# Patient Record
Sex: Female | Born: 2005 | Race: White | Hispanic: No | Marital: Single | Smoking: Never smoker
Health system: Southern US, Community
[De-identification: ages and names within clinical notes are randomized; demographics above are authoritative.]

## PROBLEM LIST (undated history)

## (undated) DIAGNOSIS — G43109 Migraine with aura, not intractable, without status migrainosus: Secondary | ICD-10-CM

## (undated) HISTORY — PX: UPPER GI ENDOSCOPY: SHX6162

## (undated) HISTORY — PX: OTHER SURGICAL HISTORY: SHX169

## (undated) HISTORY — PX: MOUTH SURGERY: SHX715

## (undated) HISTORY — DX: Migraine with aura, not intractable, without status migrainosus: G43.109

---
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
## 2024-10-19 ENCOUNTER — Other Ambulatory Visit: Payer: Self-pay

## 2024-10-19 ENCOUNTER — Emergency Department
Admission: EM | Admit: 2024-10-19 | Discharge: 2024-10-19 | Disposition: A | Discharge status: Home / Self Care | Payer: Worker's Compensation | Source: Ambulatory Visit | Attending: Emergency Medicine | Admitting: Emergency Medicine

## 2024-10-19 DIAGNOSIS — S0003XA Contusion of scalp, initial encounter: Secondary | ICD-10-CM | POA: Diagnosis not present

## 2024-10-19 DIAGNOSIS — Y990 Civilian activity done for income or pay: Secondary | ICD-10-CM | POA: Insufficient documentation

## 2024-10-19 DIAGNOSIS — W208XXA Other cause of strike by thrown, projected or falling object, initial encounter: Secondary | ICD-10-CM | POA: Diagnosis not present

## 2024-10-19 DIAGNOSIS — S0990XA Unspecified injury of head, initial encounter: Secondary | ICD-10-CM

## 2024-10-19 DIAGNOSIS — R519 Headache, unspecified: Secondary | ICD-10-CM | POA: Diagnosis present

## 2024-10-19 MED ORDER — METOCLOPRAMIDE HCL 5 MG PO TABS: 5.0000 mg | ORAL_TABLET | Freq: Three times a day (TID) | ORAL | 1 refills | Status: AC | PRN

## 2024-10-19 MED ORDER — METOCLOPRAMIDE HCL 10 MG PO TABS
10.0000 mg | ORAL_TABLET | Freq: Once | ORAL | Status: AC
  Administered 2024-10-19: 10 mg via ORAL
  Filled 2024-10-19: qty 1

## 2024-10-19 NOTE — ED Triage Notes (Signed)
 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 Patient complaining dizziness and headache.

## 2024-10-19 NOTE — ED Provider Notes (Signed)
 [facility] Emergency Department Provider Note     Event Date/Time   First MD Initiated Contact with Patient 10/19/24 1206     (approximate)   History   Headache   HPI  Heidi Curry is a 18 y.o. female with a history of migraine, presents to the ED endorsing intermittent headache and dizziness after work-related injury.  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  Denies any LOC, nausea, vomiting, vision loss, or weakness.  She presents to the ED for evaluation of injuries.  No reports of any distal paresthesias, vision change, tinnitus, or vertigo.  She endorses some mild tenderness over crown of her scalp, but denies any gross scalp laceration or bleeding.  No medications were taken in the interim for symptom relief.   Physical Exam   Triage Vital Signs: ED Triage Vitals  Encounter Vitals Group     BP 10/19/24 1144 131/87     Girls Systolic BP Percentile --      Girls Diastolic BP Percentile --      Boys Systolic BP Percentile --      Boys Diastolic BP Percentile --      Pulse Rate 10/19/24 1144 66     Resp 10/19/24 1144 18     Temp 10/19/24 1144 98.1 F (36.7 C)     Temp Source 10/19/24 1144 Oral     SpO2 10/19/24 1144 100 %     Weight 10/19/24 1146 183 lb (83 kg)     Height 10/19/24 1146 5' 3 (1.6 m)     Head Circumference --      Peak Flow --      Pain Score --      Pain Loc --      Pain Education --      Exclude from Growth Chart --     Most recent vital signs: Vitals:   10/19/24 1144  BP: 131/87  Pulse: 66  Resp: 18  Temp: 98.1 F (36.7 C)  SpO2: 100%    General Awake, no distress.  NAD.  A&O x 4 HEENT NCAT no scalp laceration, hematoma, abrasion, ecchymosis, or erythema.SABRA PERRL. EOMI. normal fundi bilaterally.  No rhinorrhea. Mucous membranes are moist.  TMs intact bilaterally without evidence of purulent serous effusion, no hemotympanum, no Battle sign noted. CV:  Good  peripheral perfusion.  RESP:  Normal effort.  MSK:  Normal spinal alignment without midline tenderness, spasm, deformity, or step-off.  AROM of all extremities. NEURO: Cranial nerves II to XII grossly intact.  No cerebellar ataxia appreciated.  Normal rapid alternating movement.  No pronator drift appreciated.  Normal tandem walk.   ED Results / Procedures / Treatments   Labs (all labs ordered are listed, but only abnormal results are displayed) Labs Reviewed - No data to display   EKG   RADIOLOGY  No results found.   PROCEDURES:  Critical Care performed: No  Procedures   MEDICATIONS ORDERED IN ED: Medications  metoCLOPramide (REGLAN) tablet 10 mg (10 mg Oral Given 10/19/24 1246)     IMPRESSION / MDM / ASSESSMENT AND PLAN / ED COURSE  I reviewed the triage vital signs and the nursing notes.                              Differential diagnosis includes, but is not limited to, head contusion, posttraumatic headache, hematoma,  abrasion, laceration  Patient's presentation is most consistent with acute, uncomplicated illness.  Patient's diagnosis is consistent with scalp contusion and minor head injury.  Patient with reassuring exam and workup following head contusion while at work yesterday.  Patient presents today in no acute distress.  She is active, alert, oriented.  She is able to give full HPI without prompts.  No neurodeficits on exam.  Patient's clinical picture is that of a minor head contusion.  No concern for serious closed head injury or postconcussive syndrome.  No indication based on my interpretation for CT imaging exam low concern for subdural hemorrhage or COVID injury.  Patient is reassured by her exam and workup at this time.  Patient will be discharged home with prescriptions for Reglan and OTC pain medicine for any needed pain relief. Patient is to follow up with her PCP as needed or otherwise directed. Patient is given ED precautions to return to the ED for  any worsening or new symptoms.     FINAL CLINICAL IMPRESSION(S) / ED DIAGNOSES   Final diagnoses:  Contusion of scalp, initial encounter  Minor head injury, initial encounter     Rx / DC Orders   ED Discharge Orders          Ordered    metoCLOPramide (REGLAN) 5 MG tablet  Every 8 hours PRN        10/19/24 1235             Note:  This document was prepared using Dragon voice recognition software and may include unintentional dictation errors.    Loyd Candida LULLA Aldona, PA-C 10/20/24 1820    Jacolyn Pae, MD 10/20/24 2258

## 2024-10-19 NOTE — Discharge Instructions (Addendum)
 Take the prescription med as directed.  Follow-up with your primary provider for ongoing evaluation.  May take OTC Tylenol  Motrin as needed.
